# Patient Record
Sex: Female | Born: 2005 | Hispanic: No | Marital: Single | Smoking: Never smoker
Health system: Southern US, Community
[De-identification: ages and names within clinical notes are randomized; demographics above are authoritative.]

## PROBLEM LIST (undated history)

## (undated) DIAGNOSIS — F902 Attention-deficit hyperactivity disorder, combined type: Principal | ICD-10-CM

## (undated) DIAGNOSIS — R278 Other lack of coordination: Secondary | ICD-10-CM

## (undated) HISTORY — PX: ADENOIDECTOMY: SUR15

## (undated) HISTORY — DX: Attention-deficit hyperactivity disorder, combined type: F90.2

## (undated) HISTORY — PX: TONSILLECTOMY: SUR1361

## (undated) HISTORY — DX: Other lack of coordination: R27.8

---
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
## 2023-09-13 ENCOUNTER — Other Ambulatory Visit: Payer: Self-pay | Admitting: Family

## 2023-09-13 DIAGNOSIS — N6001 Solitary cyst of right breast: Secondary | ICD-10-CM

## 2023-10-04 ENCOUNTER — Ambulatory Visit
Admission: RE | Admit: 2023-10-04 | Discharge: 2023-10-04 | Disposition: A | Discharge status: Home / Self Care | Payer: Self-pay | Source: Ambulatory Visit | Attending: Family | Admitting: Family

## 2023-10-04 DIAGNOSIS — N6001 Solitary cyst of right breast: Secondary | ICD-10-CM

## 2023-10-21 ENCOUNTER — Other Ambulatory Visit (HOSPITAL_BASED_OUTPATIENT_CLINIC_OR_DEPARTMENT_OTHER): Payer: Self-pay

## 2023-10-21 MED ORDER — METHYLPHENIDATE 20 MG/9HR TD PTCH
1.0000 | MEDICATED_PATCH | Freq: Every day | TRANSDERMAL | 0 refills | Status: DC
  Filled 2023-10-21: qty 30, 30d supply, fill #0

## 2023-12-10 ENCOUNTER — Other Ambulatory Visit (HOSPITAL_BASED_OUTPATIENT_CLINIC_OR_DEPARTMENT_OTHER): Payer: Self-pay

## 2023-12-10 ENCOUNTER — Other Ambulatory Visit: Payer: Self-pay

## 2023-12-10 MED ORDER — METHYLPHENIDATE 10 MG/9HR TD PTCH
10.0000 mg | MEDICATED_PATCH | Freq: Every day | TRANSDERMAL | 0 refills | Status: AC
  Filled 2023-12-10: qty 30, 30d supply, fill #0

## 2023-12-13 ENCOUNTER — Other Ambulatory Visit (HOSPITAL_BASED_OUTPATIENT_CLINIC_OR_DEPARTMENT_OTHER): Payer: Self-pay

## 2024-01-05 ENCOUNTER — Other Ambulatory Visit (HOSPITAL_BASED_OUTPATIENT_CLINIC_OR_DEPARTMENT_OTHER): Payer: Self-pay

## 2024-01-05 MED ORDER — METHYLPHENIDATE 20 MG/9HR TD PTCH
1.0000 | MEDICATED_PATCH | Freq: Every day | TRANSDERMAL | 0 refills | Status: AC
  Filled 2024-01-05 (×2): qty 30, 30d supply, fill #0

## 2024-01-11 ENCOUNTER — Other Ambulatory Visit (HOSPITAL_COMMUNITY): Payer: Self-pay

## 2024-01-17 ENCOUNTER — Other Ambulatory Visit (HOSPITAL_BASED_OUTPATIENT_CLINIC_OR_DEPARTMENT_OTHER): Payer: Self-pay
